# Patient Record
Sex: Male | Born: 1990 | Race: White | Hispanic: No | Marital: Single | Smoking: Never smoker
Health system: Southern US, Community
[De-identification: ages and names within clinical notes are randomized; demographics above are authoritative.]

---
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
## 2014-11-13 ENCOUNTER — Encounter: Payer: Self-pay | Admitting: Family Medicine

## 2014-11-13 ENCOUNTER — Ambulatory Visit
Admission: EM | Admit: 2014-11-13 | Discharge: 2014-11-13 | Disposition: A | Discharge status: Home / Self Care | Payer: Managed Care, Other (non HMO) | Attending: Family Medicine | Admitting: Family Medicine

## 2014-11-13 DIAGNOSIS — J029 Acute pharyngitis, unspecified: Secondary | ICD-10-CM | POA: Diagnosis not present

## 2014-11-13 LAB — RAPID STREP SCREEN (MED CTR MEBANE ONLY): Streptococcus, Group A Screen (Direct): NEGATIVE

## 2014-11-13 MED ORDER — HYDROCOD POLST-CPM POLST ER 10-8 MG/5ML PO SUER: 5.0000 mL | Freq: Two times a day (BID) | ORAL | Status: AC

## 2014-11-13 NOTE — ED Notes (Signed)
patient states he started with a sore throat yesterday and then the fever. He also complains of a headache. He has white spots on his tonsils. His parents have had the same symptoms. They were not tested for strep.

## 2014-11-13 NOTE — ED Provider Notes (Signed)
CSN: [id number]     Arrival date & time 11/13/14  1303 History   First MD Initiated Contact with Patient 11/13/14 1343     Chief Complaint  Patient presents with  . Sore Throat    yesterday  . Fever   (Consider location/radiation/quality/duration/timing/severity/associated sxs/prior Treatment) HPI   Is a 24 year old autistic male accompanied by his mother who presents with a one-day history of sore throat headache nonproductive cough that is worse in the evening tactile fever and body aches. His mother has been sick with the same symptoms and was treated by her primary care physician with amoxicillin and is still not quite over her illness. She had been sick for over a week. He denies any nausea vomiting or diarrhea denies any abdominal pain.  History reviewed. No pertinent past medical history. Past Surgical History  Procedure Laterality Date  . Dental surgery  2009   Family History  Problem Relation Age of Onset  . Arthritis Mother   . Hyperlipidemia Mother   . Diabetes Father   . Hypertension Father   . Prostatitis Father    History  Substance Use Topics  . Smoking status: Never Smoker   . Smokeless tobacco: Not on file  . Alcohol Use: No    Review of Systems  Constitutional: Positive for fever.  HENT: Positive for sore throat.   Musculoskeletal: Positive for myalgias.  All other systems reviewed and are negative.   Allergies  Review of patient's allergies indicates no known allergies.  Home Medications   Prior to Admission medications   Medication Sig Start Date End Date Taking? Authorizing Provider  chlorpheniramine-HYDROcodone (TUSSIONEX PENNKINETIC ER) 10-8 MG/5ML SUER Take 5 mLs by mouth 2 (two) times daily. 11/13/14   Chrissie NoaWilliam P Jarmarcus Wambold, PA-C   BP 105/74 mmHg  Pulse 98  Temp(Src) 98.7 F (37.1 C) (Oral)  Resp 18  Ht 6\' 1"  (1.854 m)  Wt 150 lb (68.04 kg)  BMI 19.79 kg/m2  SpO2 97% Physical Exam  Constitutional: He is oriented to person, place, and  time. He appears well-developed and well-nourished.  HENT:  Head: Normocephalic and atraumatic.  Eyes: EOM are normal. Pupils are equal, round, and reactive to light. Right eye exhibits no discharge. Left eye exhibits no discharge.  Neck: Normal range of motion. Neck supple.  Cardiovascular: Normal rate, regular rhythm and normal heart sounds.  Exam reveals no gallop and no friction rub.   No murmur heard. Pulmonary/Chest: Effort normal and breath sounds normal. No stridor. No respiratory distress. He has no wheezes. He has no rales. He exhibits no tenderness.  Abdominal: Soft. Bowel sounds are normal.  Musculoskeletal: Normal range of motion.  Lymphadenopathy:    He has no cervical adenopathy.  Neurological: He is alert and oriented to person, place, and time. He has normal reflexes.  Skin: Skin is warm and dry. No rash noted.  Psychiatric: He has a normal mood and affect. His behavior is normal. Judgment and thought content normal.    ED Course  Procedures (including critical care time) Labs Review Labs Reviewed  RAPID STREP SCREEN  CULTURE, GROUP A STREP [facility]    Imaging Review No results found.   MDM   1. Acute pharyngitis, unspecified pharyngitis type    Plan: 1. Test/x-ray results and diagnosis reviewed with patient 2. rx as per orders; risks, benefits, potential side effects reviewed with patient 3. Recommend supportive treatment with  4. F/u prn if symptoms worsen or don't improve New Prescriptions   CHLORPHENIRAMINE-HYDROCODONE (TUSSIONEX PENNKINETIC  ER) 10-8 MG/5ML SUER    Take 5 mLs by mouth 2 (two) times daily.    The patient will call in 48 hours for results of the strep culture. He will take ibuprofen and Tylenol alternating for fever and  increase fluids and rest as necessary.  Lutricia FeilWilliam P Emonte Dieujuste, PA-C 11/13/14 1423

## 2014-11-16 LAB — CULTURE, GROUP A STREP (THRC)

## 2016-02-28 ENCOUNTER — Emergency Department
Admission: EM | Admit: 2016-02-28 | Discharge: 2016-02-28 | Disposition: A | Discharge status: Home / Self Care | Payer: Managed Care, Other (non HMO) | Attending: Emergency Medicine | Admitting: Emergency Medicine

## 2016-02-28 ENCOUNTER — Emergency Department: Payer: Managed Care, Other (non HMO)

## 2016-02-28 ENCOUNTER — Encounter: Payer: Self-pay | Admitting: Emergency Medicine

## 2016-02-28 DIAGNOSIS — Y929 Unspecified place or not applicable: Secondary | ICD-10-CM | POA: Diagnosis not present

## 2016-02-28 DIAGNOSIS — S060X1A Concussion with loss of consciousness of 30 minutes or less, initial encounter: Secondary | ICD-10-CM | POA: Diagnosis not present

## 2016-02-28 DIAGNOSIS — Y999 Unspecified external cause status: Secondary | ICD-10-CM | POA: Diagnosis not present

## 2016-02-28 DIAGNOSIS — Z23 Encounter for immunization: Secondary | ICD-10-CM | POA: Insufficient documentation

## 2016-02-28 DIAGNOSIS — S0181XA Laceration without foreign body of other part of head, initial encounter: Secondary | ICD-10-CM | POA: Diagnosis not present

## 2016-02-28 DIAGNOSIS — Y939 Activity, unspecified: Secondary | ICD-10-CM | POA: Diagnosis not present

## 2016-02-28 DIAGNOSIS — S40012A Contusion of left shoulder, initial encounter: Secondary | ICD-10-CM

## 2016-02-28 DIAGNOSIS — W228XXA Striking against or struck by other objects, initial encounter: Secondary | ICD-10-CM | POA: Diagnosis not present

## 2016-02-28 DIAGNOSIS — R55 Syncope and collapse: Secondary | ICD-10-CM

## 2016-02-28 LAB — BASIC METABOLIC PANEL
Anion gap: 8 (ref 5–15)
BUN: 14 mg/dL (ref 6–20)
CALCIUM: 9 mg/dL (ref 8.9–10.3)
CO2: 26 mmol/L (ref 22–32)
Chloride: 104 mmol/L (ref 101–111)
Creatinine, Ser: 0.76 mg/dL (ref 0.61–1.24)
GFR calc Af Amer: >60 mL/min (ref >60)
GFR calc non Af Amer: >60 mL/min (ref >60)
GLUCOSE: 134 mg/dL — AB (ref 65–99)
POTASSIUM: 3.5 mmol/L (ref 3.5–5.1)
Sodium: 138 mmol/L (ref 135–145)

## 2016-02-28 LAB — CBC
HEMATOCRIT: 41.9 % (ref 40.0–52.0)
Hemoglobin: 14.7 g/dL (ref 13.0–18.0)
MCH: 31.2 pg (ref 26.0–34.0)
MCHC: 35.2 g/dL (ref 32.0–36.0)
MCV: 88.8 fL (ref 80.0–100.0)
Platelets: 193 10*3/uL (ref 150–440)
RBC: 4.72 MIL/uL (ref 4.40–5.90)
RDW: 12.5 % (ref 11.5–14.5)
WBC: 2.9 10*3/uL — ABNORMAL LOW (ref 3.8–10.6)

## 2016-02-28 LAB — CK: Total CK: 98 U/L (ref 49–397)

## 2016-02-28 LAB — TROPONIN I: Troponin I: <0.03 ng/mL (ref <0.03)

## 2016-02-28 LAB — SEDIMENTATION RATE: SED RATE: 2 mm/h (ref 0–15)

## 2016-02-28 MED ORDER — OXYCODONE-ACETAMINOPHEN 5-325 MG PO TABS: 1.0000 | ORAL_TABLET | ORAL | Status: DC

## 2016-02-28 MED ORDER — LIDOCAINE-EPINEPHRINE (PF) 1 %-1:200000 IJ SOLN
INTRAMUSCULAR | Status: AC
  Administered 2016-02-28: 30 mL
  Filled 2016-02-28: qty 30

## 2016-02-28 MED ORDER — SODIUM CHLORIDE 0.9 % IV BOLUS (SEPSIS)
1000.0000 mL | Freq: Once | INTRAVENOUS | Status: AC
  Administered 2016-02-28: 1000 mL via INTRAVENOUS

## 2016-02-28 MED ORDER — IBUPROFEN 600 MG PO TABS
ORAL_TABLET | ORAL | Status: AC
  Administered 2016-02-28: 600 mg via ORAL
  Filled 2016-02-28: qty 1

## 2016-02-28 MED ORDER — IBUPROFEN 600 MG PO TABS
600.0000 mg | ORAL_TABLET | Freq: Once | ORAL | Status: AC
  Administered 2016-02-28: 600 mg via ORAL

## 2016-02-28 MED ORDER — OXYCODONE-ACETAMINOPHEN 5-325 MG PO TABS
1.0000 | ORAL_TABLET | ORAL | Status: AC
  Administered 2016-02-28: 1 via ORAL
  Filled 2016-02-28: qty 1

## 2016-02-28 MED ORDER — ONDANSETRON HCL 4 MG/2ML IJ SOLN
INTRAMUSCULAR | Status: AC
Start: 2016-02-28 — End: 2016-02-28
  Administered 2016-02-28: 4 mg via INTRAVENOUS
  Filled 2016-02-28: qty 2

## 2016-02-28 MED ORDER — LIDOCAINE-EPINEPHRINE (PF) 1 %-1:200000 IJ SOLN
30.0000 mL | Freq: Once | INTRAMUSCULAR | Status: AC
  Administered 2016-02-28: 30 mL

## 2016-02-28 MED ORDER — TETANUS-DIPHTH-ACELL PERTUSSIS 5-2.5-18.5 LF-MCG/0.5 IM SUSP
0.5000 mL | Freq: Once | INTRAMUSCULAR | Status: AC
  Administered 2016-02-28: 0.5 mL via INTRAMUSCULAR
  Filled 2016-02-28: qty 0.5

## 2016-02-28 MED ORDER — ONDANSETRON 4 MG PO TBDP: 4.0000 mg | ORAL_TABLET | Freq: Four times a day (QID) | ORAL | 0 refills | Status: AC | PRN

## 2016-02-28 MED ORDER — ONDANSETRON HCL 4 MG/2ML IJ SOLN
4.0000 mg | Freq: Once | INTRAMUSCULAR | Status: AC
  Administered 2016-02-28: 4 mg via INTRAVENOUS

## 2016-02-28 NOTE — ED Notes (Signed)
Pt's mother states pt fell straight forward onto his face. Lac above eyebrow caused by pt's glasses. Reports pt was unconscious for 2-3 minutes.

## 2016-02-28 NOTE — Discharge Instructions (Signed)
You have been seen in the Emergency Department (ED) today for a laceration (cut).  Please keep the cut clean but do not submerge it in the water.  It has been repaired with staples or sutures that will need to be removed in about 5-7 days. Please follow up with your doctor, an urgent care, or return to the ED for suture removal.   ° °Please take Tylenol (acetaminophen) or Motrin (ibuprofen) as needed for discomfort as written on the box.  ° °Please follow up with your doctor as soon as possible regarding today's emergent visit.  ° °Return to the ED or call your doctor if you notice any signs of infection such as fever, increased pain, increased redness, pus, or other symptoms that concern you. ° °

## 2016-02-28 NOTE — ED Notes (Signed)
Patient transported to CT 

## 2016-02-28 NOTE — ED Triage Notes (Signed)
Pt is autistic, presents to ED from home c/o syncopal episode and fall with head involvement. Pt states he felt hot and then remembers waking up on the floor. Has never had this happen before. Pt's father told EMS he witnessed fall, saw pt hit fridge with the L side of his head. 2cm lac to L eyebrow. States emesis x1.

## 2016-02-28 NOTE — ED Notes (Signed)
Gave pt a blanket

## 2016-02-28 NOTE — ED Notes (Signed)

## 2016-02-28 NOTE — ED Provider Notes (Addendum)
[facility] Emergency Department Provider Note   ____________________________________________   First MD Initiated Contact with Patient 02/28/16 1521     (approximate)  I have reviewed the triage vital signs and the nursing notes.   HISTORY  Chief Complaint Loss of Consciousness and Fall    HPI Ross Wright is a 25 y.o. male reports no significant medical problems. Reports he had just gotten up sleeping, while standing he started to feel warm and lightheaded. Then reports that he "woke up on the floor". Family heard him fall, he reports he is having a moderate left frontal headache and has a small cut over his left eye.  Reports he's been in normal health. Eating and drinking normally but not anything yet to eat or drink this afternoon has he just got up from bed.  Denies any chest pain or trouble breathing. No neck pain. No numbness tingling or weakness. He reports that he went to bed about 4 in the morning which is typical as he likes to "game" at night.  History reviewed. No pertinent past medical history.  There are no active problems to display for this patient.   Past Surgical History:  Procedure Laterality Date  . DENTAL SURGERY  2009    Prior to Admission medications   Medication Sig Start Date End Date Taking? Authorizing Provider  chlorpheniramine-HYDROcodone (TUSSIONEX PENNKINETIC ER) 10-8 MG/5ML SUER Take 5 mLs by mouth 2 (two) times daily. 11/13/14   Lutricia FeilWilliam P Roemer, PA-C  ondansetron (ZOFRAN ODT) 4 MG disintegrating tablet Take 1 tablet (4 mg total) by mouth every 6 (six) hours as needed for nausea or vomiting. 02/28/16   Sharyn CreamerMark Jelisha Weed, MD  Patient tells me is currently not taking any medications  Allergies Review of patient's allergies indicates no known allergies.  Family History  Problem Relation Age of Onset  . Arthritis Mother   . Hyperlipidemia Mother   . Diabetes Father   . Hypertension Father   . Prostatitis Father      Social History Social History  Substance Use Topics  . Smoking status: Never Smoker  . Smokeless tobacco: Never Used  . Alcohol use No    Review of Systems Constitutional: No fever/chills Eyes: No visual changes. Felt like she was blacking out just prior to passing out, denies any vision change or eye pain. ENT: No sore throat. Cardiovascular: Denies chest pain. Respiratory: Denies shortness of breath. Gastrointestinal: No abdominal pain.  Some mild nausea and vomited twice after striking his head. No diarrhea.  No constipation. Genitourinary: Negative for dysuria. Musculoskeletal: Negative for back pain. No neck pain Skin: Negative for rash. Neurological: Negative for headaches, focal weakness or numbness.  10-point ROS otherwise negative.  ____________________________________________   PHYSICAL EXAM:  VITAL SIGNS: ED Triage Vitals  Enc Vitals Group     BP 02/28/16 1514 112/71     Pulse Rate 02/28/16 1514 73     Resp 02/28/16 1514 (!) 8     Temp 02/28/16 1514 97.3 F (36.3 C)     Temp Source 02/28/16 1514 Oral     SpO2 02/28/16 1514 100 %     Weight 02/28/16 1516 140 lb (63.5 kg)     Height 02/28/16 1516 6' (1.829 m)     Head Circumference --      Peak Flow --      Pain Score 02/28/16 1516 7     Pain Loc --      Pain Edu? --  Excl. in GC? --    Constitutional: Alert and oriented. Well appearing and in no acute distress. Eyes: Conjunctivae are normal. PERRL. EOMI. Head: Atraumatic except a 1.5 cm superficial laceration just lateral and superior to the left canthus. Nose: No congestion/rhinnorhea. Mouth/Throat: Mucous membranes are Dry.  Oropharynx non-erythematous. Neck: No stridor.  No cervical spine tenderness. Full range of motion of the cervical spine without pain. Cardiovascular: Normal rate, regular rhythm. Grossly normal heart sounds.  Good peripheral circulation. Respiratory: Normal respiratory effort.  No retractions. Lungs  CTAB. Gastrointestinal: Soft and nontender. No distention.  Musculoskeletal:   RIGHT Right upper extremity demonstrates normal strength, good use of all muscles. No edema bruising or contusions of the right shoulder/upper arm, right elbow, right forearm / hand. Full range of motion of the right right upper extremity without pain. No evidence of trauma. Strong radial pulse. Intact median/ulnar/radial neuro-muscular exam.  LEFT Left upper extremity demonstrates normal strength, good use of all muscles. No edema bruising or contusions of the left shoulder/upper arm, left elbow, left forearm / hand. Full range of motion of the left  upper extremity without pain. No evidence of trauma. Strong radial pulse. Intact median/ulnar/radial neuro-muscular exam.  Lower Extremities  No edema. Normal DP/PT pulses bilateral with good cap refill.  Normal neuro-motor function lower extremities bilateral.  RIGHT Right lower extremity demonstrates normal strength, good use of all muscles. No edema bruising or contusions of the right hip, right knee, right ankle. Full range of motion of the right lower extremity without pain. No pain on axial loading. No evidence of trauma.  LEFT Left lower extremity demonstrates normal strength, good use of all muscles. No edema bruising or contusions of the hip,  knee, ankle. Full range of motion of the left lower extremity without pain. No pain on axial loading. No evidence of trauma.   Neurologic:  Normal speech and language. No gross focal neurologic deficits are appreciated. No gait instability. Skin:  Skin is warm, dry and intact. No rash noted. Psychiatric: Mood and affect are normal. Speech and behavior are normal.  ____________________________________________   LABS (all labs ordered are listed, but only abnormal results are displayed)  Labs Reviewed  BASIC METABOLIC PANEL - Abnormal; Notable for the following:       Result Value   Glucose, Bld 134 (*)    All  other components within normal limits  CBC - Abnormal; Notable for the following:    WBC 2.9 (*)    All other components within normal limits  TROPONIN I  SEDIMENTATION RATE  CK   ____________________________________________  EKG  Reviewed and interpreted by me at 1515 Ventricular rate 75 QRS 100 QTc 420 Normal sinus rhythm There is J-point elevation noted in inferolateral lateral distribution, most consistent with early repolarization abnormality. No PR depression to suggest pericarditis. No ST elevation is present, the patient denies any chest pain shortness of breath or cardiopulmonary symptoms. Reviewed and transmitted to Dr. Juliann Pares of cardiology who advises this is consistent with early repolarization, and no ischemic change. ____________________________________________  RADIOLOGY  Ct Head Wo Contrast  Result Date: 02/28/2016 CLINICAL DATA:  Syncope today with a fall.  Initial encounter. EXAM: CT HEAD WITHOUT CONTRAST TECHNIQUE: Contiguous axial images were obtained from the base of the skull through the vertex without intravenous contrast. COMPARISON:  None. FINDINGS: Brain: Appears normal without hemorrhage, infarct, mass lesion, mass effect, midline shift or abnormal extra-axial fluid collection. No hydrocephalus or pneumocephalus. Vascular: Unremarkable. Skull: Intact. Sinuses/Orbits: Unremarkable. Other: None.  IMPRESSION: Negative head CT. Electronically Signed   By: Drusilla Kanner M.D.   On: 02/28/2016 15:40   Dg Shoulder Left  Result Date: 02/28/2016 CLINICAL DATA:  Pain after fall. EXAM: LEFT SHOULDER - 2+ VIEW COMPARISON:  None. FINDINGS: The transscapular Y and axillary views are suboptimally positioned. Within this limitation, no fracture or dislocation is identified. IMPRESSION: Limited study with no fracture or dislocation identified. Electronically Signed   By: Gerome Sam III M.D   On: 02/28/2016 16:43     ____________________________________________   PROCEDURES  Procedure(s) performed: Laceration  Procedures  Critical Care performed: No  ____________________________________________   INITIAL IMPRESSION / ASSESSMENT AND PLAN / ED COURSE  Pertinent labs & imaging results that were available during my care of the patient were reviewed by me and considered in my medical decision making (see chart for details).  LACERATION REPAIR Performed by: Sharyn Creamer Authorized by: Sharyn Creamer Consent: Verbal consent obtained. Risks and benefits: risks, benefits and alternatives were discussed Consent given by: patient Patient identity confirmed: provided demographic data Prepped and Draped in normal sterile fashion Wound explored  Laceration Location: Left lateral eyebrow, left superior orbital rim  Laceration Length: 1.5 cm  No Foreign Bodies seen or palpated  Anesthesia: local infiltration  Local anesthetic: lidocaine 1% with epinephrine  Anesthetic total: 1 ml  Irrigation method: syringe Amount of cleaning: standard  Skin closure: 6-0 prolene  Number of sutures: 3  Technique: Simple interrupted   Patient tolerance: Patient tolerated the procedure well with no immediate complications.   Clinical Course  Value Comment By Time   ECG reviewed closely and sent for reviewed by Dr. Juliann Pares. He reports appears consistent with early repolarization, no evidence of ischemic change and not pericarditis is no PR depression. Sharyn Creamer, MD 09/04 1536  CT Head Wo Contrast (Reviewed) Sharyn Creamer, MD 09/04 1537    Patient presents after syncopal episode. This occurred with standing just after getting up from bed. He now has completely normal neurologic recovery, does moderately have a left-sided headache and has vomited twice thus we'll order CT to evaluate for intracranial injury though felt to be low risk overall. Not anticoagulated. Fall from a standing height. He denies any  chest pain or cardiac symptoms, no risk factors for pulmonary embolism. EKG reviewed, discussed with cardiology of field consistent with early repolarization.  We'll hydrate, provide pain control and antiemetic. Await CT scan. ____________________________________________   FINAL CLINICAL IMPRESSION(S) / ED DIAGNOSES  Final diagnoses:  Facial laceration, initial encounter  Syncope and collapse  Contusion of left shoulder, initial encounter  Mild concussion, with loss of consciousness of 30 minutes or less, initial encounter      NEW MEDICATIONS STARTED DURING THIS VISIT:  New Prescriptions   ONDANSETRON (ZOFRAN ODT) 4 MG DISINTEGRATING TABLET    Take 1 tablet (4 mg total) by mouth every 6 (six) hours as needed for nausea or vomiting.   ----------------------------------------- 5:23 PM on 02/28/2016 -----------------------------------------  Laceration without repair. Patient asymptomatic states he feels well with no concerns. Headache and nausea resolved. Return precautions and treatment recommendations and follow-up discussed with the patient who is agreeable with the plan.   Note:  This document was prepared using Dragon voice recognition software and may include unintentional dictation errors.     Sharyn Creamer, MD 02/28/16 1722    Sharyn Creamer, MD 02/28/16 [phone]

## 2016-03-09 ENCOUNTER — Emergency Department
Admission: EM | Admit: 2016-03-09 | Discharge: 2016-03-09 | Disposition: A | Discharge status: Home / Self Care | Payer: Managed Care, Other (non HMO) | Attending: Student | Admitting: Student

## 2016-03-09 ENCOUNTER — Emergency Department: Payer: Managed Care, Other (non HMO)

## 2016-03-09 ENCOUNTER — Encounter: Payer: Self-pay | Admitting: Emergency Medicine

## 2016-03-09 DIAGNOSIS — R079 Chest pain, unspecified: Secondary | ICD-10-CM

## 2016-03-09 DIAGNOSIS — R0602 Shortness of breath: Secondary | ICD-10-CM | POA: Insufficient documentation

## 2016-03-09 DIAGNOSIS — R0789 Other chest pain: Secondary | ICD-10-CM | POA: Insufficient documentation

## 2016-03-09 LAB — COMPREHENSIVE METABOLIC PANEL
ALBUMIN: 4.9 g/dL (ref 3.5–5.0)
ALT: 34 U/L (ref 17–63)
AST: 21 U/L (ref 15–41)
Alkaline Phosphatase: 61 U/L (ref 38–126)
Anion gap: 8 (ref 5–15)
BUN: 13 mg/dL (ref 6–20)
CO2: 29 mmol/L (ref 22–32)
Calcium: 9.2 mg/dL (ref 8.9–10.3)
Chloride: 102 mmol/L (ref 101–111)
Creatinine, Ser: 0.75 mg/dL (ref 0.61–1.24)
GFR calc Af Amer: >60 mL/min (ref >60)
GLUCOSE: 90 mg/dL (ref 65–99)
POTASSIUM: 3.9 mmol/L (ref 3.5–5.1)
Sodium: 139 mmol/L (ref 135–145)
Total Bilirubin: 0.9 mg/dL (ref 0.3–1.2)
Total Protein: 7.7 g/dL (ref 6.5–8.1)

## 2016-03-09 LAB — CBC WITH DIFFERENTIAL/PLATELET
Basophils Absolute: 0 10*3/uL (ref 0–0.1)
Basophils Relative: 1 %
EOS PCT: 2 %
Eosinophils Absolute: 0 10*3/uL (ref 0–0.7)
HEMATOCRIT: 42.6 % (ref 40.0–52.0)
Hemoglobin: 14.7 g/dL (ref 13.0–18.0)
LYMPHS ABS: 0.9 10*3/uL — AB (ref 1.0–3.6)
LYMPHS PCT: 32 %
MCH: 31 pg (ref 26.0–34.0)
MCHC: 34.6 g/dL (ref 32.0–36.0)
MCV: 89.6 fL (ref 80.0–100.0)
Monocytes Absolute: 0.3 10*3/uL (ref 0.2–1.0)
Monocytes Relative: 10 %
Neutro Abs: 1.6 10*3/uL (ref 1.4–6.5)
Neutrophils Relative %: 55 %
PLATELETS: 234 10*3/uL (ref 150–440)
RBC: 4.75 MIL/uL (ref 4.40–5.90)
RDW: 12.8 % (ref 11.5–14.5)
WBC: 2.8 10*3/uL — ABNORMAL LOW (ref 3.8–10.6)

## 2016-03-09 LAB — TROPONIN I
Troponin I: <0.03 ng/mL (ref <0.03)
Troponin I: <0.03 ng/mL

## 2016-03-09 MED ORDER — IBUPROFEN 600 MG PO TABS
600.0000 mg | ORAL_TABLET | Freq: Once | ORAL | Status: AC
  Administered 2016-03-09: 600 mg via ORAL
  Filled 2016-03-09: qty 1

## 2016-03-09 MED ORDER — IBUPROFEN 600 MG PO TABS: 600.0000 mg | ORAL_TABLET | Freq: Four times a day (QID) | ORAL | 0 refills | Status: AC | PRN

## 2016-03-09 NOTE — ED Notes (Addendum)
Pt. [facility] due [facility] "anxiety that they stated could not be treated there". According to parents in room with pt.   Pt. Restless and fidgiting while this RN in room for initial assessment.

## 2016-03-09 NOTE — ED Notes (Signed)
Pt given sandwich and family members given some drinks. Pt was escorted to restroom. Pt sitting comfortably at this time.

## 2016-03-09 NOTE — ED Provider Notes (Addendum)
[name] Emergency Department Provider Note   ____________________________________________   First MD Initiated Contact with Patient 03/09/16 1843     (approximate)  I have reviewed the triage vital signs and the nursing notes.   HISTORY  Chief Complaint Chest Pain and Shortness of Breath    HPI Ross Wright is a 25 y.o. male with no chronic medical problems who presents for evaluation of 3 days of intermittent left chest pain radiating to the left arm, gradual onset, occurring 2-3 times per day, currently mild, no modifying factors. He reports the pain feels like a "tightness" in the left chest, it happens at rest, it is not worse with exertion and if anything it improves when he stands up and moves around. He does have some associated shortness of breath. Pain is not worse with deep inspiration. Pain does not radiate to the back, down towards the feet, is not ripping or tearing in nature. She reports that his pain has been constant since 2 PM today. He denies any history of coronary artery disease, no family history of early coronary artery disease or sudden cardiac death. He denies any history of PE or DVT. He denies any exogenous estrogen use, hemoptysis, recent long period of Immobilization, or recent surgeries. He was seen at his primary care doctor's office just prior to arrival and sent to the emergency department for evaluation.   History reviewed. No pertinent past medical history.  There are no active problems to display for this patient.   Past Surgical History:  Procedure Laterality Date  . DENTAL SURGERY  2009    Prior to Admission medications   Medication Sig Start Date End Date Taking? Authorizing Provider  chlorpheniramine-HYDROcodone (TUSSIONEX PENNKINETIC ER) 10-8 MG/5ML SUER Take 5 mLs by mouth 2 (two) times daily. 11/13/14   Lutricia FeilWilliam P Roemer, PA-C  ibuprofen (ADVIL,MOTRIN) 600 MG tablet Take 1 tablet (600 mg total) by mouth every 6  (six) hours as needed for moderate pain. 03/09/16   Gayla DossEryka A Evynn Boutelle, MD  ondansetron (ZOFRAN ODT) 4 MG disintegrating tablet Take 1 tablet (4 mg total) by mouth every 6 (six) hours as needed for nausea or vomiting. 02/28/16   Sharyn CreamerMark Quale, MD    Allergies Review of patient's allergies indicates no known allergies.  Family History  Problem Relation Age of Onset  . Arthritis Mother   . Hyperlipidemia Mother   . Diabetes Father   . Hypertension Father   . Prostatitis Father     Social History Social History  Substance Use Topics  . Smoking status: Never Smoker  . Smokeless tobacco: Never Used  . Alcohol use No    Review of Systems Constitutional: No fever/chills Eyes: No visual changes. ENT: No sore throat. Cardiovascular: + chest pain. Respiratory: + shortness of breath. Gastrointestinal: No abdominal pain.  No nausea, no vomiting.  No diarrhea.  No constipation. Genitourinary: Negative for dysuria. Musculoskeletal: Negative for back pain. Skin: Negative for rash. Neurological: Negative for headaches, focal weakness or numbness.  10-point ROS otherwise negative.  ____________________________________________   PHYSICAL EXAM:  Vitals:   03/09/16 1900 03/09/16 1930 03/09/16 2000 03/09/16 2012  BP: 115/74 105/75 113/74 115/73  Pulse: 65 69  74  Resp: (!) 25 (!) 22 15 17   Temp:      TempSrc:      SpO2: 98% 98%  98%  Weight:      Height:        VITAL SIGNS: ED Triage Vitals  Enc Vitals Group  BP 03/09/16 1552 111/66     Pulse Rate 03/09/16 1552 85     Resp 03/09/16 1552 18     Temp 03/09/16 1552 97.8 F (36.6 C)     Temp Source 03/09/16 1552 Oral     SpO2 03/09/16 1552 98 %     Weight 03/09/16 1552 140 lb (63.5 kg)     Height 03/09/16 1552 6' (1.829 m)     Head Circumference --      Peak Flow --      Pain Score 03/09/16 1553 2     Pain Loc --      Pain Edu? --      Excl. in GC? --     Constitutional: Alert and oriented. Well appearing and in no acute  distress.Appears anxious with mild psychomotor agitation. Eyes: Conjunctivae are normal. PERRL. EOMI. Head: Atraumatic. Nose: No congestion/rhinnorhea. Mouth/Throat: Mucous membranes are moist.  Oropharynx non-erythematous. Neck: No stridor. Supple without meningismus. Cardiovascular: Normal rate, regular rhythm. Grossly normal heart sounds.  Good peripheral circulation. Respiratory: Normal respiratory effort.  No retractions. Lungs CTAB. Gastrointestinal: Soft and nontender. No distention.  No CVA tenderness. Genitourinary: Deferred Musculoskeletal: No lower extremity tenderness nor edema.  No joint effusions. No calf swelling/tenderness/asymmetry. No Tenderness to palpation throughout the anterior chest wall. Neurologic:  Normal speech and language. No gross focal neurologic deficits are appreciated. No gait instability. Skin:  Skin is warm, dry and intact. No rash noted. Psychiatric: Mildly anxious appearing with mild psychomotor agitation. Speech and behavior are normal.  ____________________________________________   LABS (all labs ordered are listed, but only abnormal results are displayed)  Labs Reviewed  CBC WITH DIFFERENTIAL/PLATELET - Abnormal; Notable for the following:       Result Value   WBC 2.8 (*)    Lymphs Abs 0.9 (*)    All other components within normal limits  COMPREHENSIVE METABOLIC PANEL  TROPONIN I  TROPONIN I   ____________________________________________  EKG  ED ECG REPORT I, Gayla Doss, the attending physician, personally viewed and interpreted this ECG.   Date: 03/09/2016  EKG Time: 15:59  Rate: 86  Rhythm: normal EKG, normal sinus rhythm  Axis: normal  Intervals:none  ST&T Change: No acute ST elevation or acute ST depression. J-point elevation diffusely likely reresents repolarization abnormality, no PR depression. EKG unchanged from 02/28/2016.  ____________________________________________  RADIOLOGY  CXR IMPRESSION:  Hyperinflation  may be voluntary or may reflect reactive airway  disease. There is no pneumonia nor other acute cardiopulmonary  abnormality.       ____________________________________________   PROCEDURES  Procedure(s) performed: None  Procedures  Critical Care performed: No  ____________________________________________   INITIAL IMPRESSION / ASSESSMENT AND PLAN / ED COURSE  Pertinent labs & imaging results that were available during my care of the patient were reviewed by me and considered in my medical decision making (see chart for details).  Fintan Grater is a 25 y.o. male with no chronic medical problems who presents for evaluation of 3 days of intermittent left chest pain radiating to the left arm, has been constant since 2 PM. On exam, he is very well-appearing and in no acute distress. Vital signs are stable and he is afebrile. His EKG is reassuring, not consistent with acute ischemia, unchanged from prior. EKG shows no PR depression, I doubt that this represents pericarditis. Nonspecific chest pain. I doubt ACS however his initial troponin was drawn only 2 hours from time of onset of his pain so will check a second  troponin. CBC shows mild leukopenia with white blood cell count of 2.8 however this is unchanged from prior. Unremarkable CMP. Chest x-ray shows no acute cardiopulmonary process. The patient is PERC negative and I doubt PE. No clinical evidence to suggest DVT. Not consistent with acute aortic dissection, pain not ripping or tearing in nature, does not radiate to the back or down towards the feet, he has symmetric pulses. We'll treat him symptomatically and anticipate discharge if the second troponin is negative.  ----------------------------------------- 8:18 PM on 03/09/2016 ----------------------------------------- Second troponin negative. Patient reports improvement of his pain at this time. We discussed meticulous return precautions, need for close PCP follow-up and he is  comfortable with the discharge plan. Will discharge with Motrin.    Clinical Course     ____________________________________________   FINAL CLINICAL IMPRESSION(S) / ED DIAGNOSES  Final diagnoses:  Chest pain, unspecified chest pain type      NEW MEDICATIONS STARTED DURING THIS VISIT:  New Prescriptions   IBUPROFEN (ADVIL,MOTRIN) 600 MG TABLET    Take 1 tablet (600 mg total) by mouth every 6 (six) hours as needed for moderate pain.     Note:  This document was prepared using Dragon voice recognition software and may include unintentional dictation errors.    Gayla Doss, MD 03/09/16 4098    Gayla Doss, MD 03/09/16 2022

## 2016-03-09 NOTE — ED Triage Notes (Signed)
Reports chest pain and sob x 3 days.  No resp distress at this time.

## 2018-05-23 IMAGING — CT CT HEAD W/O CM
4 series · 16 of 47 positions shown, 18 images · non-contrast
Comparison: None.

CLINICAL DATA: Syncope today with a fall.  Initial encounter.

EXAM:
CT HEAD WITHOUT CONTRAST
TECHNIQUE: Contiguous axial images were obtained from the base of the skull
through the vertex without intravenous contrast.

[Series 2: head wo · axial · 0.42mm/px · z∈[+233,+333]mm · 7 of 28 slices shown, 9 images]
[im 4/28  brain]
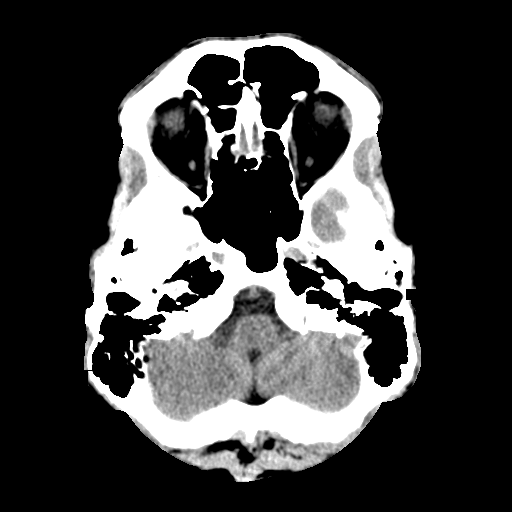
[im 4/28  bone]
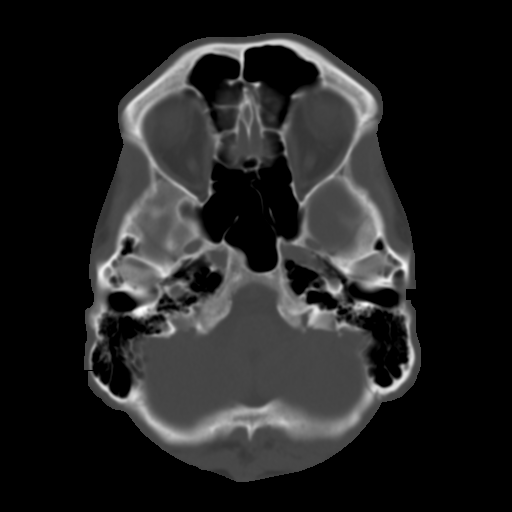
[im 7/28  brain]
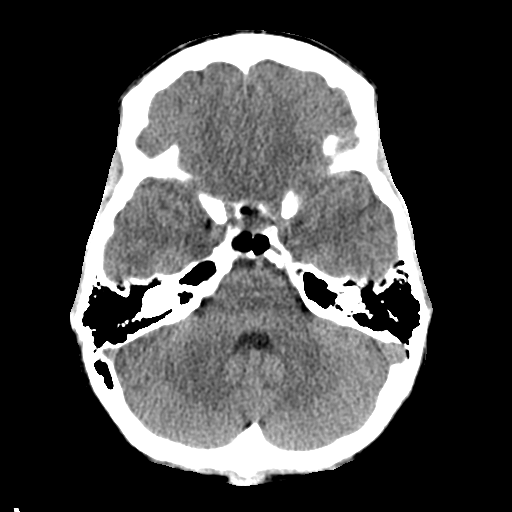
[im 11/28  brain]
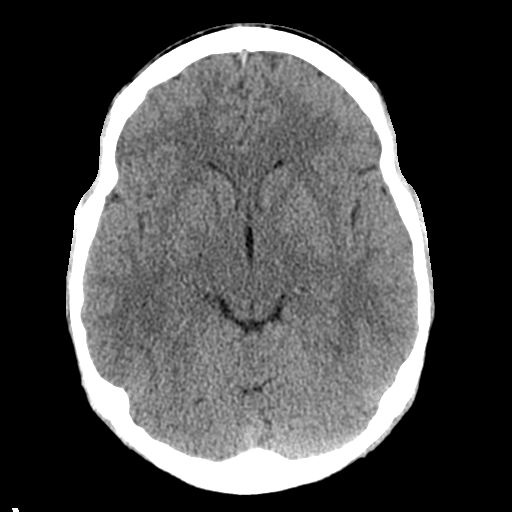
[im 14/28  brain]
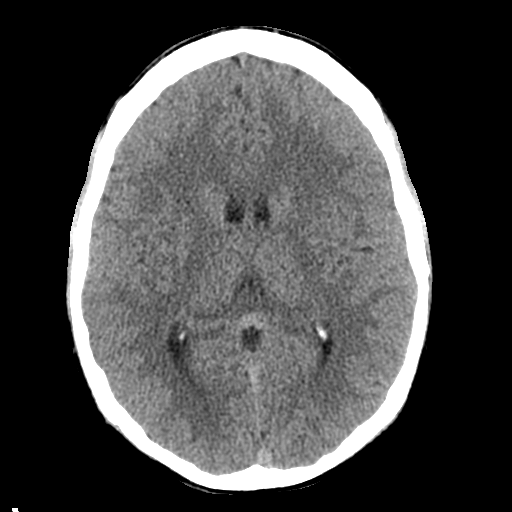
[im 17/28  brain]
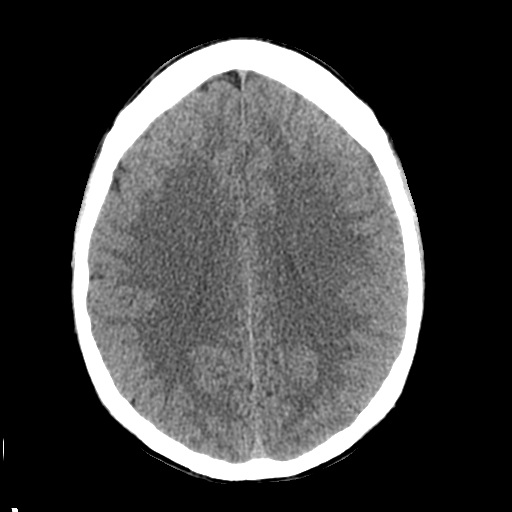
[im 17/28  bone]
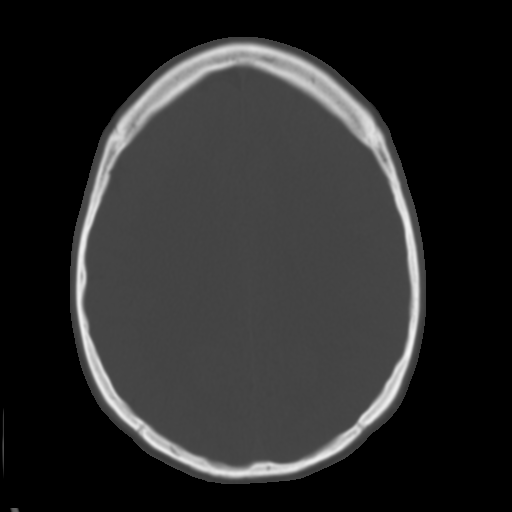
[im 21/28  brain]
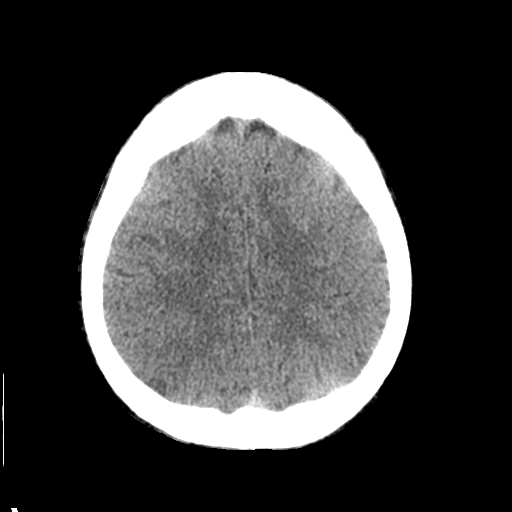
[im 24/28  brain]
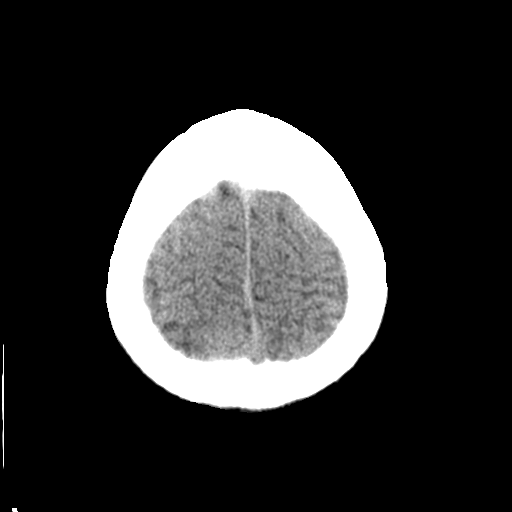

[Series 3: head bone · axial · 0.42mm/px · z∈[+230,+258]mm · 3 of 70 slices shown]
[im 7/70  bone]
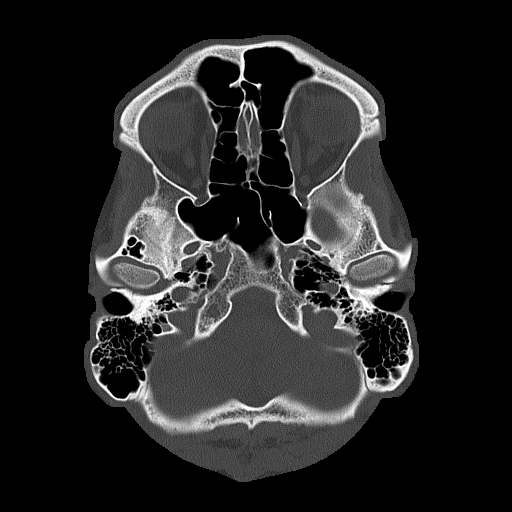
[im 14/70  bone]
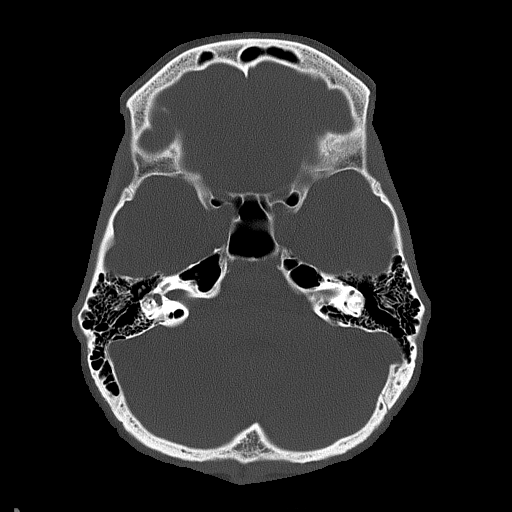
[im 21/70  bone]
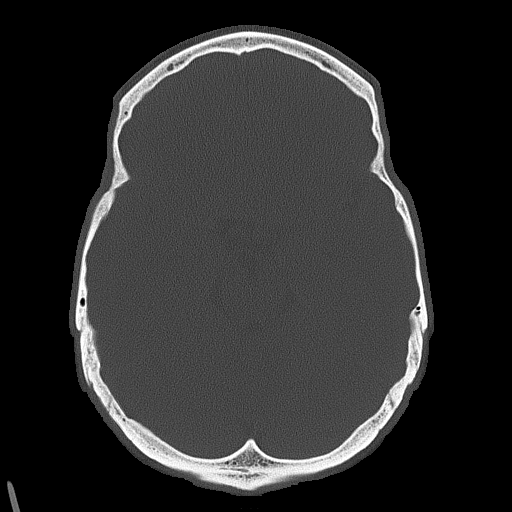

[Series 4: coronal soft tissue · coronal · 0.28mm/px · 3 of 74 slices shown]
[im 25/74  brain]
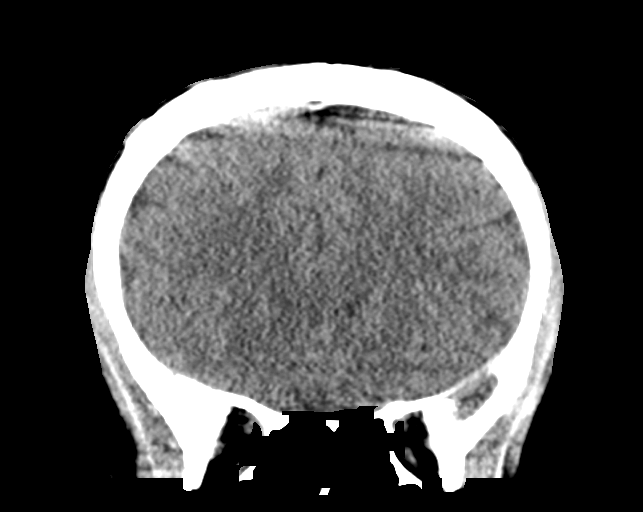
[im 33/74  brain]
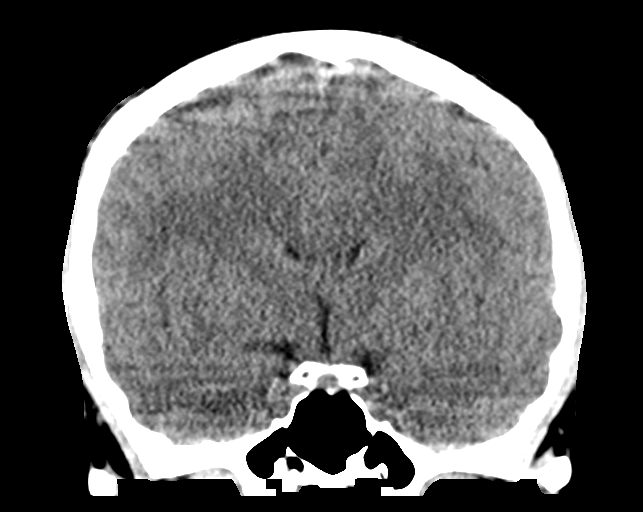
[im 41/74  brain]
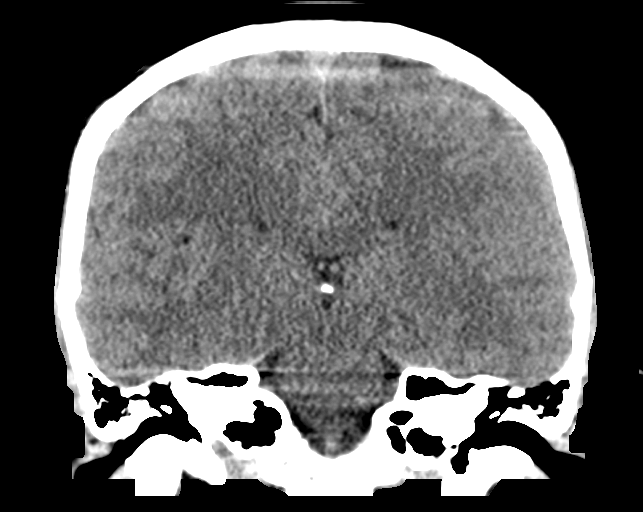

[Series 5: sagittal soft tissue · sagittal · 0.29mm/px · 3 of 63 slices shown]
[im 21/63  brain]
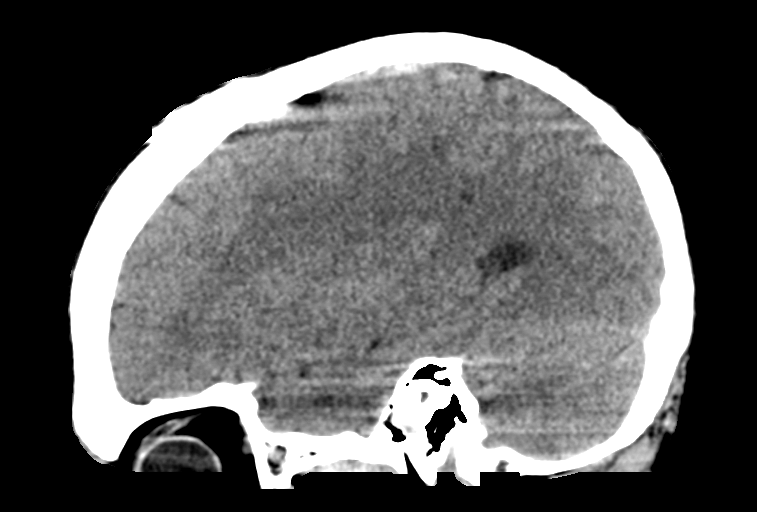
[im 32/63  brain]
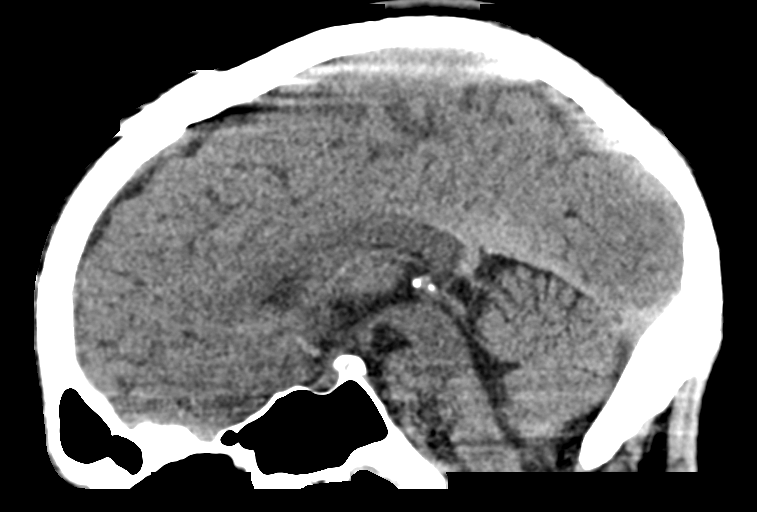
[im 42/63  brain]
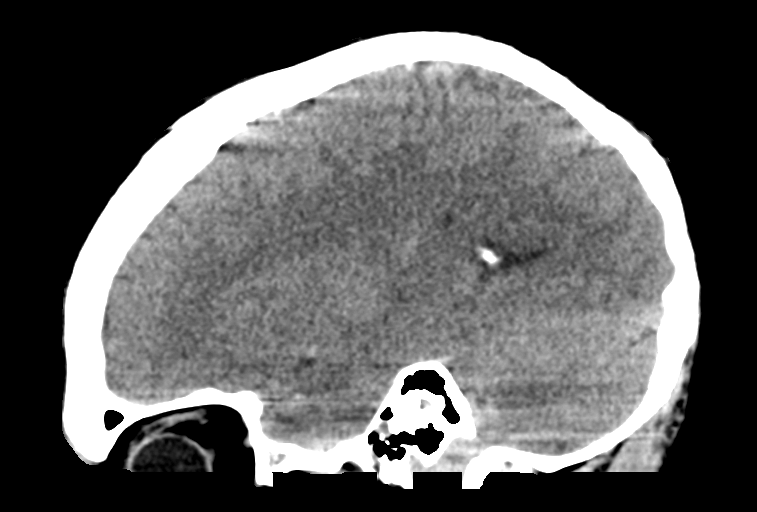

[16 of 47 positions shown; findings below may reference images not displayed]

FINDINGS: Brain: Appears normal without hemorrhage, infarct, mass lesion, mass
effect, midline shift or abnormal extra-axial fluid collection. No
hydrocephalus or pneumocephalus.

Vascular: Unremarkable.

Skull: Intact.

Sinuses/Orbits: Unremarkable.

Other: None.
IMPRESSION: Negative head CT.

## 2018-06-02 IMAGING — CR DG CHEST 2V
1 series · 2 of 2 positions shown · non-contrast
Comparison: None in PACs

CLINICAL DATA: Chest pain and shortness of breath for the past 3
days. Currently no respiratory distress. Patient reports symptoms
may have been related to stress. Nonsmoker.

EXAM:
CHEST  2 VIEW

[Series 1: w chest pa · 0.14mm/px · 2 of 2 slices shown]
[im 1/2]
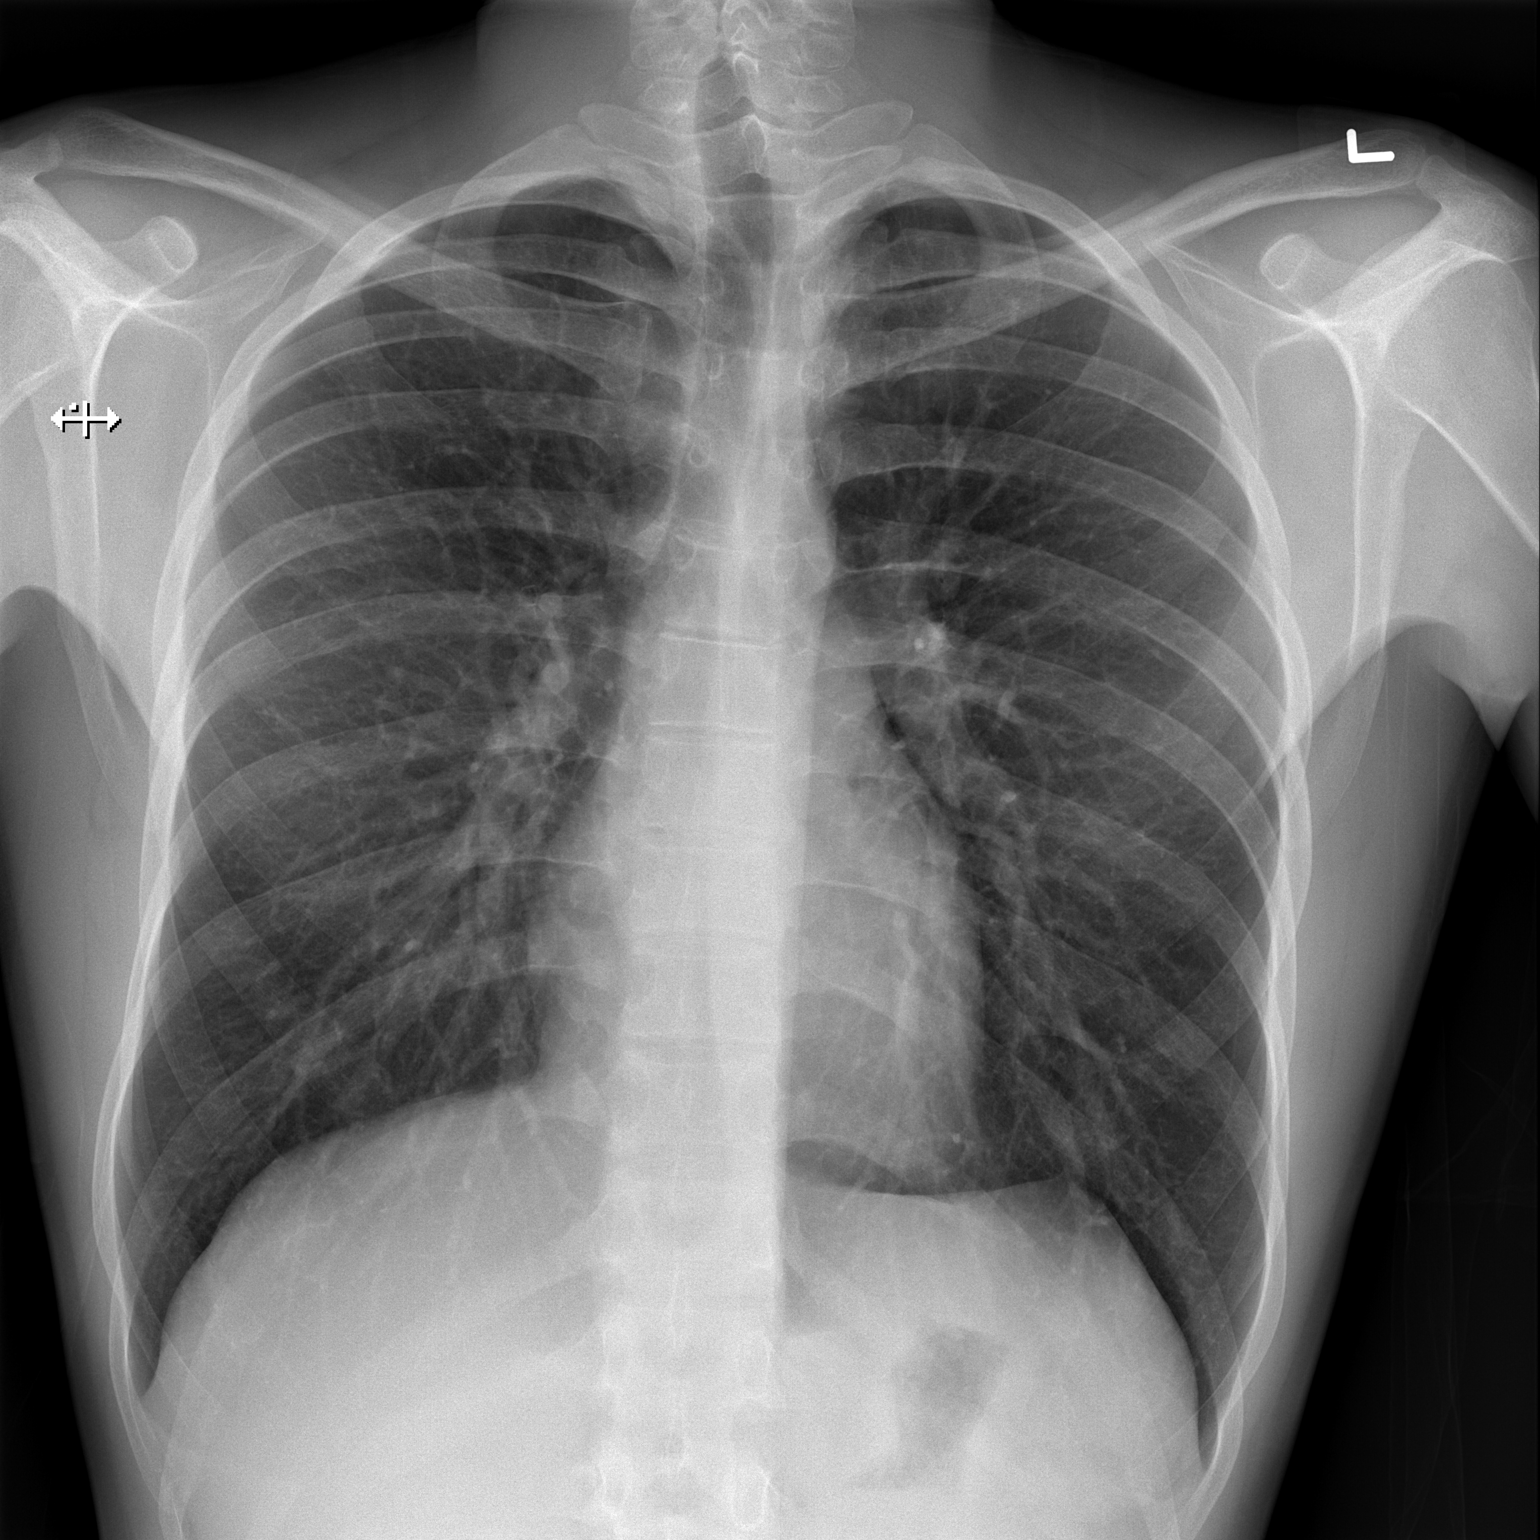
[im 2/2]
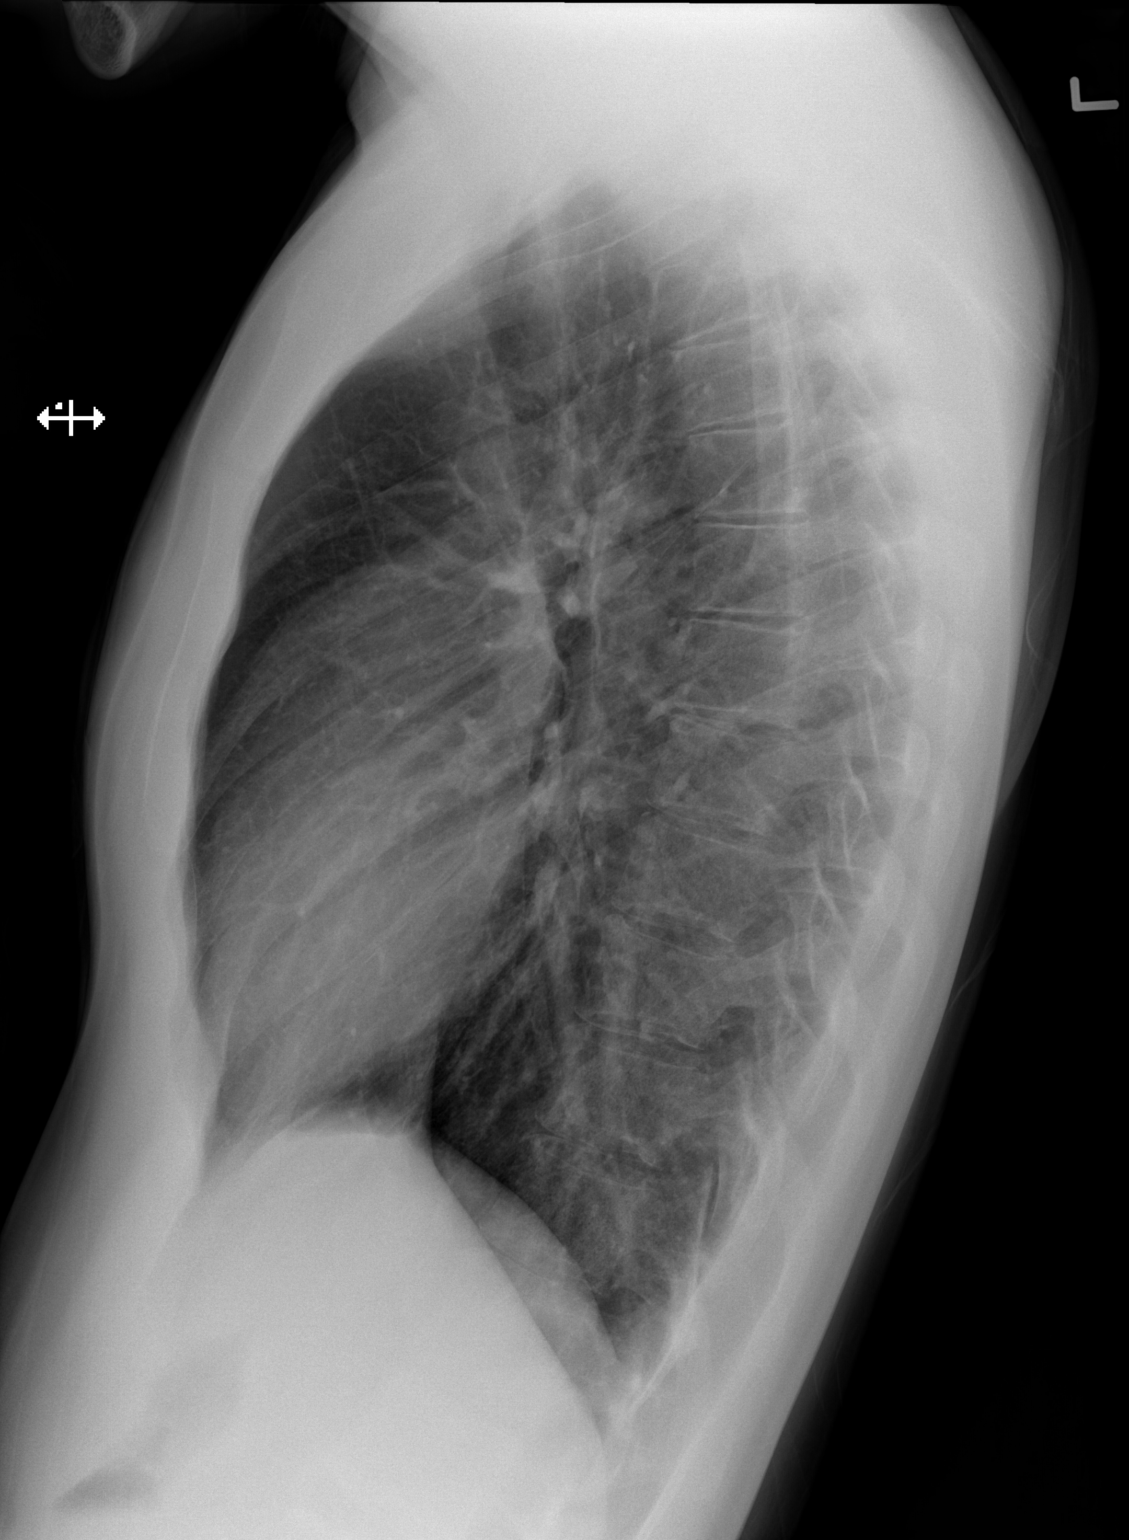

[2 of 2 positions shown; findings below may reference images not displayed]

FINDINGS: The lungs are mildly hyperinflated with increased AP dimension of
the thorax. There is no interstitial or alveolar infiltrate. There
is no pleural effusion. The heart and pulmonary vascularity are
normal. The bony thorax is unremarkable.
IMPRESSION: Hyperinflation may be voluntary or may reflect reactive airway
disease. There is no pneumonia nor other acute cardiopulmonary
abnormality.
